# Patient Record
Sex: Female | Born: 2000 | Race: White | Hispanic: No | Marital: Single | Smoking: Never smoker
Health system: Southern US, Community
[De-identification: ages and names within clinical notes are randomized; demographics above are authoritative.]

---
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
## 2020-10-05 ENCOUNTER — Emergency Department: Payer: 59

## 2020-10-05 ENCOUNTER — Emergency Department
Admission: EM | Admit: 2020-10-05 | Discharge: 2020-10-05 | Disposition: A | Discharge status: Home / Self Care | Payer: 59 | Attending: Emergency Medicine | Admitting: Emergency Medicine

## 2020-10-05 ENCOUNTER — Other Ambulatory Visit: Payer: Self-pay

## 2020-10-05 DIAGNOSIS — M79642 Pain in left hand: Secondary | ICD-10-CM | POA: Diagnosis not present

## 2020-10-05 DIAGNOSIS — M25532 Pain in left wrist: Secondary | ICD-10-CM | POA: Insufficient documentation

## 2020-10-05 DIAGNOSIS — T50903A Poisoning by unspecified drugs, medicaments and biological substances, assault, initial encounter: Secondary | ICD-10-CM

## 2020-10-05 DIAGNOSIS — T50901A Poisoning by unspecified drugs, medicaments and biological substances, accidental (unintentional), initial encounter: Secondary | ICD-10-CM | POA: Diagnosis not present

## 2020-10-05 DIAGNOSIS — S0990XA Unspecified injury of head, initial encounter: Secondary | ICD-10-CM | POA: Diagnosis present

## 2020-10-05 DIAGNOSIS — W108XXA Fall (on) (from) other stairs and steps, initial encounter: Secondary | ICD-10-CM | POA: Diagnosis not present

## 2020-10-05 DIAGNOSIS — S060X1A Concussion with loss of consciousness of 30 minutes or less, initial encounter: Secondary | ICD-10-CM | POA: Diagnosis not present

## 2020-10-05 LAB — PREGNANCY, URINE: Preg Test, Ur: NEGATIVE

## 2020-10-05 LAB — URINALYSIS, COMPLETE (UACMP) WITH MICROSCOPIC
Bilirubin Urine: NEGATIVE
Glucose, UA: NEGATIVE mg/dL
Hgb urine dipstick: NEGATIVE
Ketones, ur: 5 mg/dL — AB
Nitrite: NEGATIVE
Protein, ur: 30 mg/dL — AB
Specific Gravity, Urine: 1.026 (ref 1.005–1.030)
pH: 5 (ref 5.0–8.0)

## 2020-10-05 LAB — URINE DRUG SCREEN, QUALITATIVE (ARMC ONLY)
Amphetamines, Ur Screen: NOT DETECTED
Barbiturates, Ur Screen: NOT DETECTED
Benzodiazepine, Ur Scrn: NOT DETECTED
Cannabinoid 50 Ng, Ur ~~LOC~~: POSITIVE — AB
Cocaine Metabolite,Ur ~~LOC~~: NOT DETECTED
MDMA (Ecstasy)Ur Screen: NOT DETECTED
Methadone Scn, Ur: NOT DETECTED
Opiate, Ur Screen: NOT DETECTED
Phencyclidine (PCP) Ur S: NOT DETECTED
Tricyclic, Ur Screen: NOT DETECTED
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
## 2021-10-12 IMAGING — CR DG WRIST COMPLETE 3+V*L*
4 series · 4 of 4 positions shown · non-contrast
Comparison: None.

CLINICAL DATA: Fall with left wrist and hand pain.

EXAM:
LEFT WRIST - COMPLETE 3+ VIEW

[wrist pa]
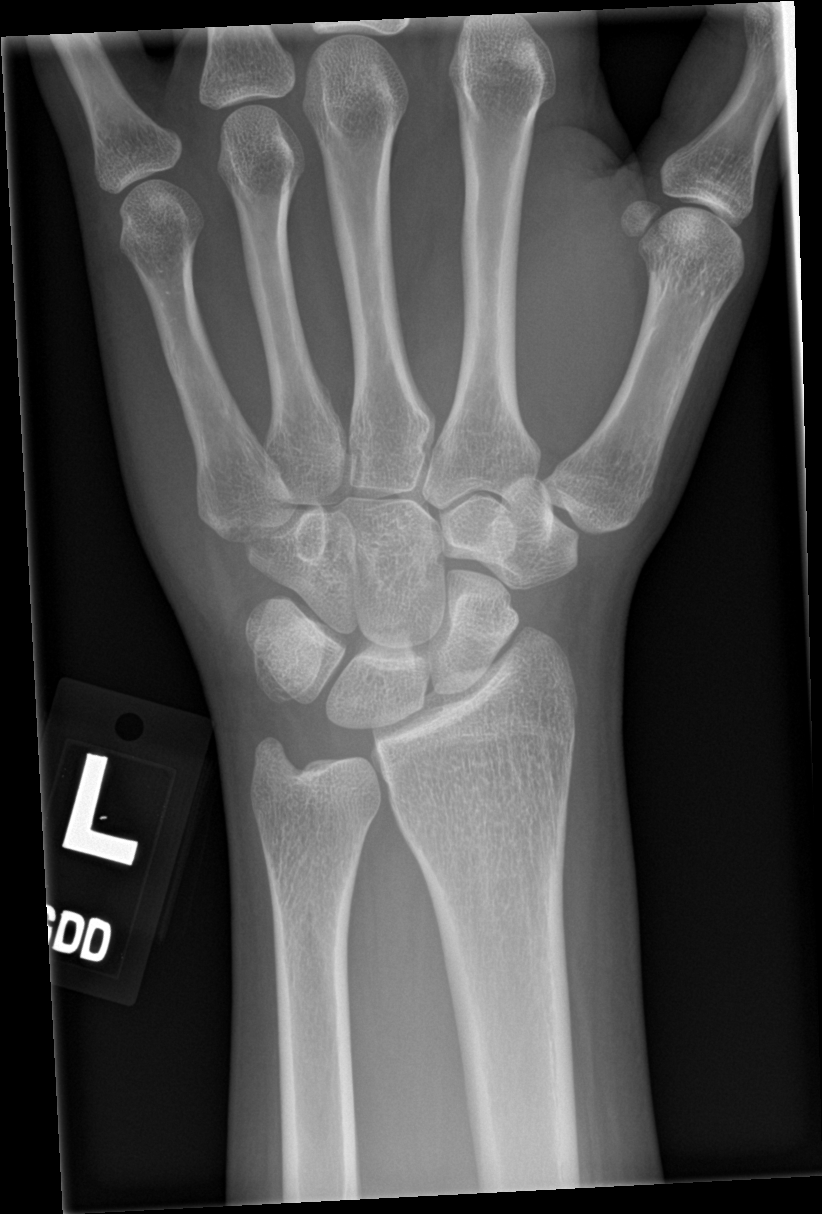

[wrist obl]
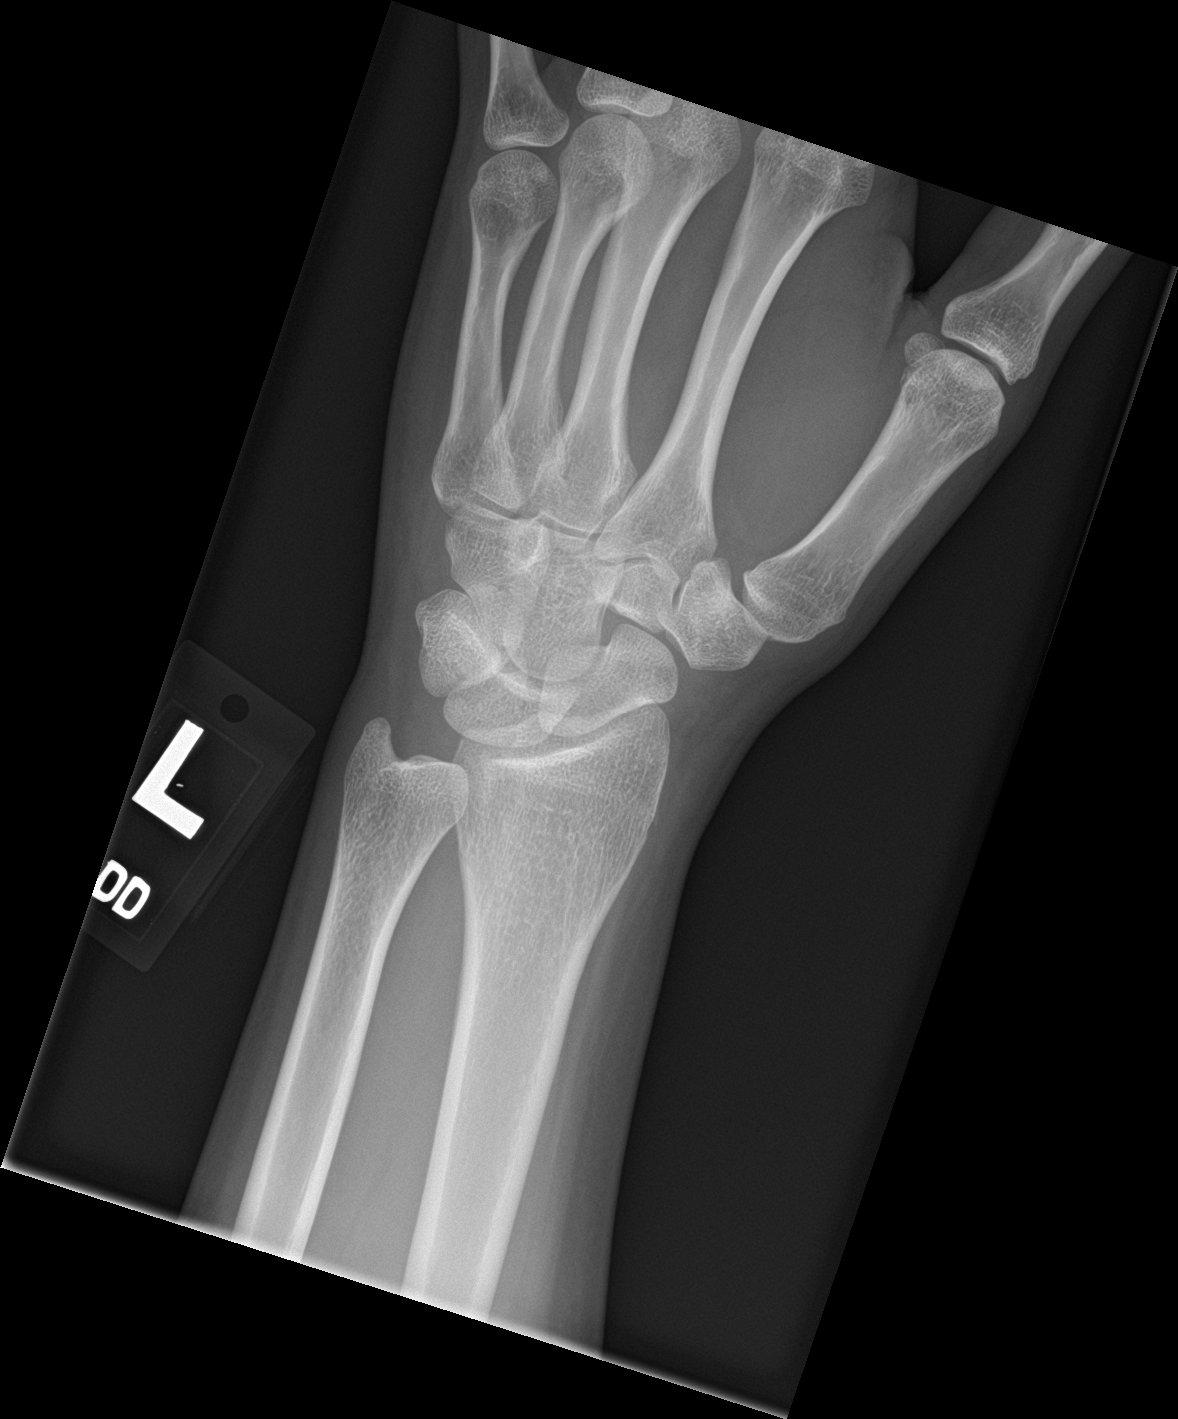

[wrist lat]
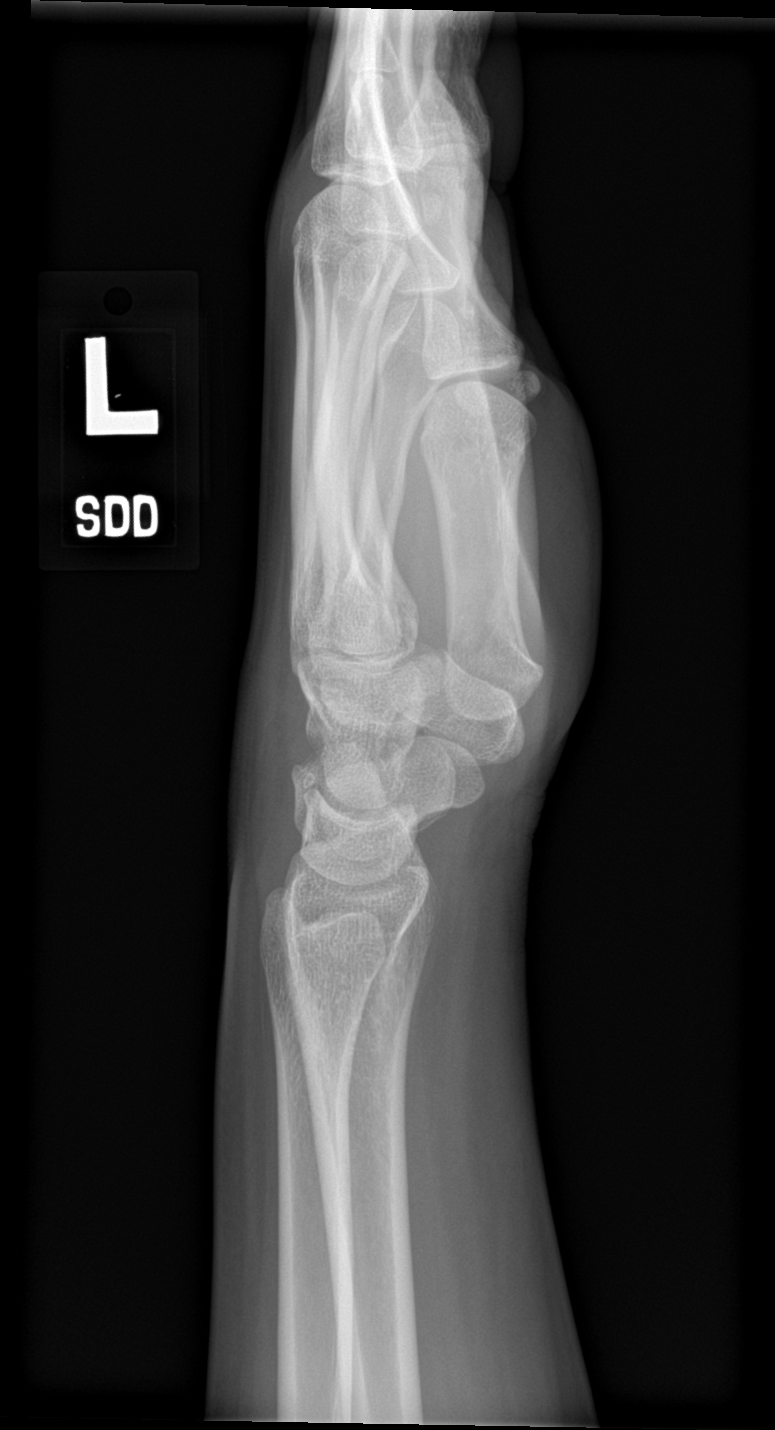

[navicular]
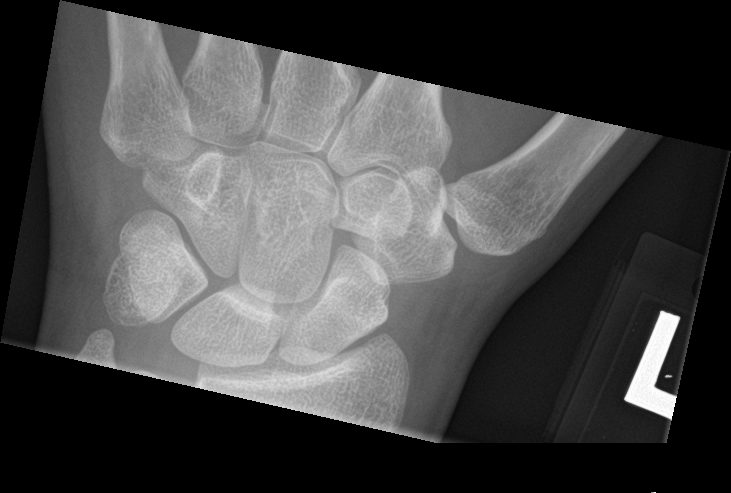

[4 of 4 positions shown; findings below may reference images not displayed]

FINDINGS: There is no evidence of fracture or dislocation. There is no
evidence of arthropathy or other focal bone abnormality. Soft
tissues are unremarkable.
IMPRESSION: Negative.

## 2022-03-07 ENCOUNTER — Emergency Department
Admission: EM | Admit: 2022-03-07 | Discharge: 2022-03-07 | Disposition: A | Discharge status: Home / Self Care | Payer: BLUE CROSS/BLUE SHIELD | Attending: Emergency Medicine | Admitting: Emergency Medicine

## 2022-03-07 ENCOUNTER — Other Ambulatory Visit: Payer: Self-pay

## 2022-03-07 ENCOUNTER — Emergency Department: Payer: BLUE CROSS/BLUE SHIELD

## 2022-03-07 DIAGNOSIS — S0993XA Unspecified injury of face, initial encounter: Secondary | ICD-10-CM | POA: Diagnosis present

## 2022-03-07 DIAGNOSIS — S0990XA Unspecified injury of head, initial encounter: Secondary | ICD-10-CM | POA: Diagnosis not present

## 2022-03-07 DIAGNOSIS — Y909 Presence of alcohol in blood, level not specified: Secondary | ICD-10-CM | POA: Insufficient documentation

## 2022-03-07 DIAGNOSIS — F10920 Alcohol use, unspecified with intoxication, uncomplicated: Secondary | ICD-10-CM

## 2022-03-07 DIAGNOSIS — F10129 Alcohol abuse with intoxication, unspecified: Secondary | ICD-10-CM | POA: Diagnosis not present

## 2022-03-07 DIAGNOSIS — X58XXXA Exposure to other specified factors, initial encounter: Secondary | ICD-10-CM | POA: Diagnosis not present

## 2022-03-07 DIAGNOSIS — S0181XA Laceration without foreign body of other part of head, initial encounter: Secondary | ICD-10-CM

## 2022-03-07 DIAGNOSIS — R739 Hyperglycemia, unspecified: Secondary | ICD-10-CM | POA: Diagnosis not present

## 2022-03-07 DIAGNOSIS — S01112A Laceration without foreign body of left eyelid and periocular area, initial encounter: Secondary | ICD-10-CM | POA: Diagnosis not present

## 2022-03-07 LAB — URINE DRUG SCREEN, QUALITATIVE (ARMC ONLY)
Amphetamines, Ur Screen: NOT DETECTED
Barbiturates, Ur Screen: NOT DETECTED
Benzodiazepine, Ur Scrn: NOT DETECTED
Cannabinoid 50 Ng, Ur ~~LOC~~: NOT DETECTED
Cocaine Metabolite,Ur ~~LOC~~: NOT DETECTED
MDMA (Ecstasy)Ur Screen: NOT DETECTED
Methadone Scn, Ur: NOT DETECTED
Opiate, Ur Screen: NOT DETECTED
Phencyclidine (PCP) Ur S: NOT DETECTED
Tricyclic, Ur Screen: NOT DETECTED

## 2022-03-07 LAB — POC URINE PREG, ED: Preg Test, Ur: NEGATIVE

## 2022-03-07 LAB — CBG MONITORING, ED: Glucose-Capillary: 125 mg/dL — ABNORMAL HIGH (ref 70–99)

## 2022-03-07 MED ORDER — BACITRACIN ZINC 500 UNIT/GM EX OINT
TOPICAL_OINTMENT | Freq: Once | CUTANEOUS | Status: AC
  Filled 2022-03-07: qty 0.9

## 2022-03-07 MED ORDER — ONDANSETRON HCL 4 MG/2ML IJ SOLN
4.0000 mg | Freq: Once | INTRAMUSCULAR | Status: AC
  Administered 2022-03-07: 4 mg via INTRAVENOUS
  Filled 2022-03-07: qty 2

## 2022-03-07 MED ORDER — SODIUM CHLORIDE 0.9 % IV BOLUS (SEPSIS)
1000.0000 mL | Freq: Once | INTRAVENOUS | Status: AC
  Administered 2022-03-07: 1000 mL via INTRAVENOUS

## 2022-03-07 NOTE — ED Notes (Signed)
Pt is able to ambulate without assistance
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
## 2022-03-07 NOTE — ED Notes (Signed)
ED Provider at bedside. 
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
## 2022-03-07 NOTE — ED Notes (Signed)
Patient transported to CT 
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
## 2022-03-07 NOTE — Discharge Instructions (Signed)
You have had a head injury resulting in a concussion.  A concussion is a clinical diagnosis and not seen on imaging (CT, MRI).  Please avoid alcohol, sedatives for the next week.  Please rest and drink plenty of water.  We recommend that you avoid any activity that may lead to another head injury for at least 1 week or until your symptoms have completely resolved.  We also recommend "brain rest" to ensure the best possible long term outcomes - please avoid TV, cell phones, tablets, computers as much as possible for the next 48 hours.     You may alternate Tylenol 1000 mg every 6 hours as needed for pain, fever and Ibuprofen 800 mg every 6-8 hours as needed for pain, fever.  Please take Ibuprofen with food.  Do not take more than 4000 mg of Tylenol (acetaminophen) in a 24 hour period.  Steps to find a Primary Care Provider (PCP):  Call [phone] or [phone] to access "[facility] Find a Doctor Service."  2.  You may also go on the [facility] website at [email or web address]   

## 2023-01-20 ENCOUNTER — Ambulatory Visit (INDEPENDENT_AMBULATORY_CARE_PROVIDER_SITE_OTHER): Payer: BLUE CROSS/BLUE SHIELD | Admitting: Adult Health

## 2023-01-20 ENCOUNTER — Encounter: Payer: Self-pay | Admitting: Adult Health

## 2023-01-20 VITALS — HR 108 | Temp 97.6°F | Ht 67.5 in | Wt 142.0 lb

## 2023-01-20 DIAGNOSIS — L03314 Cellulitis of groin: Secondary | ICD-10-CM

## 2023-01-20 MED ORDER — SULFAMETHOXAZOLE-TRIMETHOPRIM 800-160 MG PO TABS: 1.0000 | ORAL_TABLET | Freq: Two times a day (BID) | ORAL | 0 refills | Status: AC

## 2023-01-20 NOTE — Progress Notes (Signed)
[facility] [place], [place] [address] Phone: [phone] Fax: [phone]   Office Visit Note  Patient Name: Brittney Thornton  Date of [id number]  Med Rec number [id number]  Date of Service: 01/20/2023  Patient has no known allergies.  Chief Complaint  Patient presents with   Ingrown Hair     HPI  Patient is here reporting that she has an ingrown hair in her bikini line.  She gets them frequently, and often they will linger.It continues to break open and bleed, which is unusual.   Current Medication:  Outpatient Encounter Medications as of 01/20/2023  Medication Sig   Estradiol Valerate-Dienogest (NATAZIA) 3/2-2/2-3/1 MG tablet Take by mouth.   sulfamethoxazole-trimethoprim (BACTRIM DS) 800-160 MG tablet Take 1 tablet by mouth 2 (two) times daily.   No facility-administered encounter medications on file as of 01/20/2023.      Medical History: No past medical history on file.   Vital Signs: Pulse (!) 108   Temp 97.6 F (36.4 C) (Tympanic)   Ht 5' 7.5" (1.715 m)   Wt 142 lb (64.4 kg)   SpO2 97%   BMI 21.91 kg/m    Review of Systems  Skin:  Positive for rash.    Physical Exam Vitals and nursing note reviewed.  Constitutional:      Appearance: Normal appearance.  Skin:    Comments: Small open wound, with drainage, mild tenderness.   Neurological:     Mental Status: She is alert.    Assessment/Plan: 1. Cellulitis of groin, right Take complete course of antibiotics as prescribed.  Take with food.  If symptoms fail to improve, or new/worse symptoms develop follow up in clinic.   - sulfamethoxazole-trimethoprim (BACTRIM DS) 800-160 MG tablet; Take 1 tablet by mouth 2 (two) times daily.  Dispense: 14 tablet; Refill: 0     General Counseling: [name] verbalizes understanding of the findings of todays visit and agrees with plan of treatment. I have discussed any further diagnostic evaluation that may be needed or ordered today. We  also reviewed her medications today. she has been encouraged to call the office with any questions or concerns that should arise related to todays visit.   No orders of the defined types were placed in this encounter.   Meds ordered this encounter  Medications   sulfamethoxazole-trimethoprim (BACTRIM DS) 800-160 MG tablet    Sig: Take 1 tablet by mouth 2 (two) times daily.    Dispense:  14 tablet    Refill:  0    Time spent:15 Minutes Time spent includes review of chart, medications, test results, and follow up plan with the patient.    Kendell Bane AGNP-C Nurse Practitioner

## 2023-02-17 ENCOUNTER — Encounter: Payer: Self-pay | Admitting: Adult Health

## 2023-02-17 ENCOUNTER — Ambulatory Visit (INDEPENDENT_AMBULATORY_CARE_PROVIDER_SITE_OTHER): Payer: BLUE CROSS/BLUE SHIELD | Admitting: Adult Health

## 2023-02-17 VITALS — BP 112/72 | HR 103 | Temp 98.1°F

## 2023-02-17 DIAGNOSIS — J301 Allergic rhinitis due to pollen: Secondary | ICD-10-CM

## 2023-02-17 NOTE — Progress Notes (Signed)
[facility]. [place], [place] [address] Phone: [phone] Fax: [phone]   Office Visit Note  Patient Name: Brittney Thornton  Date of [id number]  Med Rec number [id number]  Date of Service: 02/17/2023  Patient has no known allergies.  Chief Complaint  Patient presents with   Acute Visit     HPI  Patient is here reporting that she is going to [place] for spring break.  She has been having sinus/allergy symptoms.  She describes for the past 10 days she has been having sinus pressure, some cough, itchy throat, headaches initially.  She takes allegra for allergies, but ran out last week.    Current Medication:  Outpatient Encounter Medications as of 02/17/2023  Medication Sig   Estradiol Valerate-Dienogest (NATAZIA) 3/2-2/2-3/1 MG tablet Take by mouth.   sulfamethoxazole-trimethoprim (BACTRIM DS) 800-160 MG tablet Take 1 tablet by mouth 2 (two) times daily. (Patient not taking: Reported on 02/17/2023)   No facility-administered encounter medications on file as of 02/17/2023.      Medical History: No past medical history on file.   Vital Signs: BP 112/72   Pulse (!) 103   Temp 98.1 F (36.7 C) (Tympanic)   SpO2 99%    Review of Systems  Constitutional:  Negative for chills, fatigue and fever.  HENT:  Positive for congestion, postnasal drip and rhinorrhea.   Eyes:  Positive for redness and itching. Negative for pain.  Respiratory:  Negative for cough.   Cardiovascular:  Negative for chest pain.  Gastrointestinal:  Negative for diarrhea and nausea.    Physical Exam Vitals and nursing note reviewed.  Constitutional:      Appearance: Normal appearance.  HENT:     Head: Normocephalic.     Right Ear: Tympanic membrane and ear canal normal.     Left Ear: Tympanic membrane and ear canal normal.     Nose: Rhinorrhea present.     Right Turbinates: Pale.     Left Turbinates: Pale.     Mouth/Throat:     Mouth: Mucous membranes are  moist.  Eyes:     Pupils: Pupils are equal, round, and reactive to light.  Pulmonary:     Effort: Pulmonary effort is normal.     Breath sounds: Normal breath sounds.  Neurological:     Mental Status: She is alert.    Assessment/Plan: 1. Seasonal allergic rhinitis due to pollen Discussed using Daily allergy medication such as Zyrtec or Claritin. Also instructed patient to use Flonase, Two sprays in each nostril twice daily.    Follow up via MyChart messenger if symptoms fail to improve or may return to clinic as needed for worsening symptoms.    General Counseling: Jenah verbalizes understanding of the findings of todays visit and agrees with plan of treatment. I have discussed any further diagnostic evaluation that may be needed or ordered today. We also reviewed her medications today. she has been encouraged to call the office with any questions or concerns that should arise related to todays visit.   No orders of the defined types were placed in this encounter.   No orders of the defined types were placed in this encounter.   Time spent:15 Minutes Time spent includes review of chart, medications, test results, and follow up plan with the patient.    Kendell Bane AGNP-C Nurse Practitioner
